# Patient Record
Sex: Female | Born: 2000 | Race: Black or African American | Hispanic: No | Marital: Single | Smoking: Never smoker
Health system: Southern US, Community
[De-identification: ages and names within clinical notes are randomized; demographics above are authoritative.]

## PROBLEM LIST (undated history)

## (undated) DIAGNOSIS — N83209 Unspecified ovarian cyst, unspecified side: Secondary | ICD-10-CM

## (undated) DIAGNOSIS — E079 Disorder of thyroid, unspecified: Secondary | ICD-10-CM

## (undated) DIAGNOSIS — G43909 Migraine, unspecified, not intractable, without status migrainosus: Secondary | ICD-10-CM

## (undated) HISTORY — PX: TONSILLECTOMY: SUR1361

---
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
## 2019-01-08 ENCOUNTER — Encounter (HOSPITAL_BASED_OUTPATIENT_CLINIC_OR_DEPARTMENT_OTHER): Payer: Self-pay

## 2019-01-08 ENCOUNTER — Other Ambulatory Visit: Payer: Self-pay

## 2019-01-08 ENCOUNTER — Emergency Department (HOSPITAL_BASED_OUTPATIENT_CLINIC_OR_DEPARTMENT_OTHER)
Admission: EM | Admit: 2019-01-08 | Discharge: 2019-01-09 | Disposition: A | Discharge status: Home / Self Care | Payer: Medicaid Other | Attending: Emergency Medicine | Admitting: Emergency Medicine

## 2019-01-08 DIAGNOSIS — Z20822 Contact with and (suspected) exposure to covid-19: Secondary | ICD-10-CM

## 2019-01-08 DIAGNOSIS — Z20828 Contact with and (suspected) exposure to other viral communicable diseases: Secondary | ICD-10-CM

## 2019-01-08 DIAGNOSIS — Z79899 Other long term (current) drug therapy: Secondary | ICD-10-CM | POA: Insufficient documentation

## 2019-01-08 DIAGNOSIS — R0602 Shortness of breath: Secondary | ICD-10-CM | POA: Diagnosis not present

## 2019-01-08 DIAGNOSIS — R438 Other disturbances of smell and taste: Secondary | ICD-10-CM | POA: Insufficient documentation

## 2019-01-08 DIAGNOSIS — R05 Cough: Secondary | ICD-10-CM | POA: Diagnosis present

## 2019-01-08 DIAGNOSIS — R0789 Other chest pain: Secondary | ICD-10-CM | POA: Diagnosis not present

## 2019-01-08 NOTE — ED Triage Notes (Addendum)
Pt c/o flu like sx started 2 weeks ago-requesting covid test due to she has been in close contact with a family meber that has been in close contact with a +covid person-NAD-steady gait

## 2019-01-09 LAB — SARS CORONAVIRUS 2 (TAT 6-24 HRS): SARS Coronavirus 2: NEGATIVE

## 2019-01-09 MED ORDER — NAPROXEN 250 MG PO TABS
500.0000 mg | ORAL_TABLET | Freq: Once | ORAL | Status: AC
  Administered 2019-01-09: 500 mg via ORAL
  Filled 2019-01-09: qty 2

## 2019-01-09 NOTE — ED Provider Notes (Signed)
[facility] Provider Note: Lowella Dell, MD, FACEP  CSN: [id number] MRN: [id number] ARRIVAL: 01/08/19 at 2213 ROOM: [facility]04/[facility]   CHIEF COMPLAINT  Cough   HISTORY OF PRESENT ILLNESS  01/09/19 1:16 AM Judy Rubio is a 18 y.o. female who developed cough and mild shortness of breath 2 weeks ago.  About a week ago she developed loss of taste and smell which resolved after several days.  She is not aware of having a fever.  She has had exposure to a Covid positive person.  She has been taking Robitussin and Mucinex with partial relief of symptoms.  She is also having chest wall pain when she coughs.   Past Medical History:  Diagnosis Date  . Thyroid disease     Past Surgical History:  Procedure Laterality Date  . TONSILLECTOMY      No family history on file.  Social History   Tobacco Use  . Smoking status: Never Smoker  . Smokeless tobacco: Never Used  Substance Use Topics  . Alcohol use: No  . Drug use: No    Prior to Admission medications   Medication Sig Start Date End Date Taking? Authorizing Provider  dicyclomine (BENTYL) 20 MG tablet Take 1 tablet (20 mg total) by mouth 2 (two) times daily as needed for spasms. 12/04/15   Loren Racer, MD  ondansetron (ZOFRAN ODT) 4 MG disintegrating tablet 4mg  ODT q4 hours prn nausea/vomit 12/04/15   12/06/15, MD  polyethylene glycol [facility] / GLYCOLAX) packet Take 17 g by mouth daily. 12/04/15   12/06/15, MD    Allergies Patient has no known allergies.   REVIEW OF SYSTEMS  Negative except as noted here or in the History of Present Illness.   PHYSICAL EXAMINATION  Initial Vital Signs Blood pressure (!) 126/94, pulse 83, temperature 99.1 F (37.3 C), temperature source Oral, resp. rate 20, SpO2 100 %.  Examination General: Well-developed, well-nourished female in no acute distress; appearance consistent with age of record HENT: normocephalic; atraumatic Eyes: Normal appearance  Neck: supple Heart: regular rate and rhythm Lungs: clear to auscultation bilaterally Abdomen: soft; nondistended; nontender; bowel sounds present Extremities: No deformity; full range of motion Neurologic: Awake, alert and oriented; motor function intact in all extremities and symmetric; no facial droop Skin: Warm and dry Psychiatric: Normal mood and affect   RESULTS  Summary of this visit's results, reviewed and interpreted by myself:   EKG Interpretation  Date/Time:    Ventricular Rate:    PR Interval:    QRS Duration:   QT Interval:    QTC Calculation:   R Axis:     Text Interpretation:        Laboratory Studies: No results found for this or any previous visit (from the past 24 hour(s)). Imaging Studies: No results found.  ED COURSE and MDM  Nursing notes, initial and subsequent vitals signs, including pulse oximetry, reviewed and interpreted by myself.  Vitals:   01/08/19 2237  BP: (!) 126/94  Pulse: 83  Resp: 20  Temp: 99.1 F (37.3 C)  TempSrc: Oral  SpO2: 100%   Medications  naproxen (NAPROSYN) tablet 500 mg (has no administration in time range)    Patient symptoms are not severe and her examination is normal.  We will provide a Covid test and advised her to remain in quarantine pending results.  Judy Rubio was evaluated in Emergency Department on 01/09/2019 for the symptoms described in the history of present illness. She was evaluated in  the context of the global COVID-19 pandemic, which necessitated consideration that the patient might be at risk for infection with the SARS-CoV-2 virus that causes COVID-19. Institutional protocols and algorithms that pertain to the evaluation of patients at risk for COVID-19 are in a state of rapid change based on information released by regulatory bodies including the CDC and federal and state organizations. These policies and algorithms were followed during the patient's care in the ED.  PROCEDURES  Procedures    ED DIAGNOSES     ICD-10-CM   1. Cough with exposure to COVID-19 virus  R05    Z20.828        Shanon Rosser, MD 01/09/19 [phone]

## 2019-12-21 ENCOUNTER — Encounter (HOSPITAL_BASED_OUTPATIENT_CLINIC_OR_DEPARTMENT_OTHER): Payer: Self-pay | Admitting: Emergency Medicine

## 2019-12-21 ENCOUNTER — Emergency Department (HOSPITAL_BASED_OUTPATIENT_CLINIC_OR_DEPARTMENT_OTHER)
Admission: EM | Admit: 2019-12-21 | Discharge: 2019-12-21 | Disposition: A | Discharge status: Home / Self Care | Payer: Medicaid Other | Attending: Emergency Medicine | Admitting: Emergency Medicine

## 2019-12-21 ENCOUNTER — Other Ambulatory Visit: Payer: Self-pay

## 2019-12-21 ENCOUNTER — Emergency Department (HOSPITAL_BASED_OUTPATIENT_CLINIC_OR_DEPARTMENT_OTHER): Payer: Medicaid Other

## 2019-12-21 DIAGNOSIS — X500XXA Overexertion from strenuous movement or load, initial encounter: Secondary | ICD-10-CM | POA: Insufficient documentation

## 2019-12-21 DIAGNOSIS — S46911A Strain of unspecified muscle, fascia and tendon at shoulder and upper arm level, right arm, initial encounter: Secondary | ICD-10-CM

## 2019-12-21 DIAGNOSIS — S4991XA Unspecified injury of right shoulder and upper arm, initial encounter: Secondary | ICD-10-CM | POA: Diagnosis present

## 2019-12-21 LAB — PREGNANCY, URINE: Preg Test, Ur: NEGATIVE

## 2019-12-21 MED ORDER — METHOCARBAMOL 500 MG PO TABS: 500.0000 mg | ORAL_TABLET | Freq: Every evening | ORAL | 0 refills | Status: DC | PRN

## 2019-12-21 MED ORDER — ACETAMINOPHEN 500 MG PO TABS
1000.0000 mg | ORAL_TABLET | Freq: Once | ORAL | Status: AC
  Administered 2019-12-21: 1000 mg via ORAL
  Filled 2019-12-21: qty 2

## 2019-12-21 NOTE — ED Provider Notes (Signed)
[facility] EMERGENCY DEPARTMENT Provider Note   CSN: [id number] Arrival date & time: 12/21/19  1611     History Chief Complaint  Patient presents with  . Shoulder Pain    Judy Rubio is a 19 y.o. female presenting for evaluation of right shoulder pain.  Patient status prior to arrival she went to grab something from the backseat and left today and had acute pain in her right shoulder.  Since then, she has continued to have pain of her right shoulder.  It is worse with movement, nothing makes it better.  It does not radiate.  No numbness in the hands.  She has not taken anything for it including Tylenol or ibuprofen.  She denies previous history of problems with her shoulder.  She denies pain in her neck or back.  HPI     Past Medical History:  Diagnosis Date  . Thyroid disease     There are no problems to display for this patient.   Past Surgical History:  Procedure Laterality Date  . TONSILLECTOMY       OB History   No obstetric history on file.     No family history on file.  Social History   Tobacco Use  . Smoking status: Never Smoker  . Smokeless tobacco: Never Used  Vaping Use  . Vaping Use: Never used  Substance Use Topics  . Alcohol use: No  . Drug use: No    Home Medications Prior to Admission medications   Medication Sig Start Date End Date Taking? Authorizing Provider  dicyclomine (BENTYL) 20 MG tablet Take 1 tablet (20 mg total) by mouth 2 (two) times daily as needed for spasms. 12/04/15   Loren Racer, MD  methocarbamol (ROBAXIN) 500 MG tablet Take 1 tablet (500 mg total) by mouth at bedtime as needed for muscle spasms. 12/21/19   Michaelle Bottomley, PA-C  ondansetron (ZOFRAN ODT) 4 MG disintegrating tablet 4mg  ODT q4 hours prn nausea/vomit 12/04/15   12/06/15, MD  polyethylene glycol [facility] / GLYCOLAX) packet Take 17 g by mouth daily. 12/04/15   12/06/15, MD    Allergies    Patient has no known  allergies.  Review of Systems   Review of Systems  Musculoskeletal: Positive for arthralgias.  Neurological: Negative for numbness.    Physical Exam Updated Vital Signs BP 110/82 (BP Location: Left Arm)   Pulse 85   Temp 98.2 F (36.8 C) (Oral)   Resp 16   Ht 5\' 3"  (1.6 m)   Wt 66.4 kg   LMP 12/02/2019   SpO2 100%   BMI 25.92 kg/m   Physical Exam Vitals and nursing note reviewed.  Constitutional:      General: She is not in acute distress.    Appearance: She is well-developed.  HENT:     Head: Normocephalic and atraumatic.  Neck:     Comments: No TTP of midline C-spine Pulmonary:     Effort: Pulmonary effort is normal.  Abdominal:     General: There is no distension.  Musculoskeletal:        General: Tenderness present.     Cervical back: Normal range of motion.     Comments: No obvious deformity or swelling of the right shoulder.  Tenderness palpation of the trapezius and right shoulder musculature.  No pain over bony protuberances.  Limited range of motion due to pain.  Negative empty can test.  Negative drop arm test. Radial pulses 2+ bilaterally. Equal grip strength bilaterally.  Skin:    General: Skin is warm.     Findings: No rash.  Neurological:     Mental Status: She is alert and oriented to person, place, and time.     ED Results / Procedures / Treatments   Labs (all labs ordered are listed, but only abnormal results are displayed) Labs Reviewed  PREGNANCY, URINE    EKG None  Radiology DG Shoulder Right  Result Date: 12/21/2019 CLINICAL DATA:  Patient was sitting in her car today and reached behind her for her bag and started having pain on top of her Rt shoulder EXAM: RIGHT SHOULDER - 2+ VIEW COMPARISON:  None. FINDINGS: There is no evidence of fracture or dislocation. There is no evidence of arthropathy or other focal bone abnormality. Soft tissues are unremarkable. IMPRESSION: Negative. Electronically Signed   By: Emmaline Kluver M.D.   On:  12/21/2019 17:08    Procedures Procedures (including critical care time)  Medications Ordered in ED Medications  acetaminophen (TYLENOL) tablet 1,000 mg (has no administration in time range)    ED Course  I have reviewed the triage vital signs and the nursing notes.  Pertinent labs & imaging results that were available during my care of the patient were reviewed by me and considered in my medical decision making (see chart for details).    MDM Rules/Calculators/A&P                          Patient presenting for evaluation of right shoulder pain.  On exam, patient is nontoxic.  She is neurovascular intact.  X-rays obtained from triage read interpreted by me, no fracture dislocation.  Pain is reproducible with palpation of the musculature, worse with movement.  Likely MSK.  No obvious complete rotator cuff tear.  Likely strain.  Discussed findings with patient.  Discussed symptomatic treatment with sling and NSAIDs and muscle x-rays.  Follow-up with Ortho as needed.  At this time, patient appears safe for discharge.  Return precautions given. patient states she understands and agrees to plan.  Final Clinical Impression(s) / ED Diagnoses Final diagnoses:  Strain of right shoulder, initial encounter    Rx / DC Orders ED Discharge Orders         Ordered    methocarbamol (ROBAXIN) 500 MG tablet  At bedtime PRN        12/21/19 1828           Alveria Apley, PA-C 12/21/19 1834    Pricilla Loveless, MD 12/22/19 [phone]

## 2019-12-21 NOTE — Discharge Instructions (Signed)
Take ibuprofen 3 times a day with meals.  Do not take other anti-inflammatories at the same time (Advil, Motrin, naproxen, Aleve). You may supplement with Tylenol if you need further pain control. Use robaxin as needed for severe pain.  Have caution, this may make you tired or groggy.  Do not drive or operate heavy machinery while taking this medicine. Use ice packs or heating pads if this helps control your pain. Use the sling to help with support and pain control. You will likely have continued pain over the next couple days.  Follow-up with orthopedics in 1 week if your symptoms are not improving. Return to the emergency room if you develop numbness, severe worsening pain, or any new, worsening, or concerning symptoms.

## 2019-12-21 NOTE — ED Triage Notes (Signed)
Reports reaching in the back seat to grab something when she felt pain in the right shoulder.  Now having pain.  Has not taken anything.  Does not appear deformed in triage.  Able to remove jacket without difficulty.

## 2019-12-29 ENCOUNTER — Ambulatory Visit: Payer: Medicaid Other | Admitting: Family Medicine

## 2020-01-01 ENCOUNTER — Ambulatory Visit: Payer: Medicaid Other | Admitting: Family Medicine

## 2020-06-23 ENCOUNTER — Emergency Department (HOSPITAL_BASED_OUTPATIENT_CLINIC_OR_DEPARTMENT_OTHER)
Admission: EM | Admit: 2020-06-23 | Discharge: 2020-06-23 | Disposition: A | Discharge status: Home / Self Care | Payer: Medicaid Other | Attending: Emergency Medicine | Admitting: Emergency Medicine

## 2020-06-23 ENCOUNTER — Encounter (HOSPITAL_BASED_OUTPATIENT_CLINIC_OR_DEPARTMENT_OTHER): Payer: Self-pay

## 2020-06-23 ENCOUNTER — Other Ambulatory Visit: Payer: Self-pay

## 2020-06-23 DIAGNOSIS — R102 Pelvic and perineal pain: Secondary | ICD-10-CM | POA: Diagnosis not present

## 2020-06-23 DIAGNOSIS — M545 Low back pain, unspecified: Secondary | ICD-10-CM | POA: Diagnosis not present

## 2020-06-23 LAB — WET PREP, GENITAL
Sperm: NONE SEEN
Trich, Wet Prep: NONE SEEN
Yeast Wet Prep HPF POC: NONE SEEN

## 2020-06-23 LAB — URINALYSIS, ROUTINE W REFLEX MICROSCOPIC
Bilirubin Urine: NEGATIVE
Glucose, UA: NEGATIVE mg/dL
Hgb urine dipstick: NEGATIVE
Ketones, ur: NEGATIVE mg/dL
Leukocytes,Ua: NEGATIVE
Nitrite: NEGATIVE
Protein, ur: NEGATIVE mg/dL
Specific Gravity, Urine: 1.01 (ref 1.005–1.030)
pH: 7.5 (ref 5.0–8.0)

## 2020-06-23 LAB — PREGNANCY, URINE: Preg Test, Ur: NEGATIVE

## 2020-06-23 MED ORDER — METRONIDAZOLE 500 MG PO TABS: 500.0000 mg | ORAL_TABLET | Freq: Two times a day (BID) | ORAL | 0 refills | Status: AC

## 2020-06-23 MED ORDER — KETOROLAC TROMETHAMINE 30 MG/ML IJ SOLN
30.0000 mg | Freq: Once | INTRAMUSCULAR | Status: AC
  Administered 2020-06-23: 30 mg via INTRAMUSCULAR
  Filled 2020-06-23: qty 1

## 2020-06-23 NOTE — ED Triage Notes (Signed)
Pt c/o "ovarian cyst" pain to right pelvic area x 2-3 weeks-NAD-steady gait

## 2020-06-23 NOTE — Discharge Instructions (Addendum)
The treatment that was sent to your pharmacy may help your abdominal discomfort.  I recommend following up with your OB/GYN for further care.  I do not think there is anything dangerous or urgent causing her discomfort.

## 2020-06-23 NOTE — ED Provider Notes (Signed)
[facility] EMERGENCY DEPARTMENT Provider Note   CSN: [id number] Arrival date & time: 06/23/20  1747     History Chief Complaint  Patient presents with  . Pelvic Pain    Judy Rubio is a 20 y.o. female.  Judy Rubio is a 20 year old woman who presents to the ED with 2 weeks of lower abdominal/pelvic discomfort.  She is a previous medical history notable for possible ovarian cyst and possible previous ovarian cyst rupture.  She reports that her lower abdominal pain seem to start about 2 weeks ago she does not remember exactly when it started.  Seem to build slowly over time.  The pain is generally dull/achy in character other occasionally sharp.  She notes some radiation around her right side to her right lower back.  The pain is primarily in her lower back as opposed to middle or upper back.  She does not think that this pain is associated with her menstrual period.  She specifically denies dysuria or polyuria.  She also reports mild, thin malodorous vaginal discharge started several days ago.  Her last menstrual period ended 2 weeks ago.  She has not been sexually active since her last menstrual period.        Past Medical History:  Diagnosis Date  . Thyroid disease     There are no problems to display for this patient.   Past Surgical History:  Procedure Laterality Date  . TONSILLECTOMY       OB History   No obstetric history on file.     No family history on file.  Social History   Tobacco Use  . Smoking status: Never Smoker  . Smokeless tobacco: Never Used  Vaping Use  . Vaping Use: Never used  Substance Use Topics  . Alcohol use: No  . Drug use: No    Home Medications Prior to Admission medications   Medication Sig Start Date End Date Taking? Authorizing Provider  dicyclomine (BENTYL) 20 MG tablet Take 1 tablet (20 mg total) by mouth 2 (two) times daily as needed for spasms. 12/04/15   Loren Racer, MD  methocarbamol (ROBAXIN) 500 MG  tablet Take 1 tablet (500 mg total) by mouth at bedtime as needed for muscle spasms. 12/21/19   Caccavale, Sophia, PA-C  ondansetron (ZOFRAN ODT) 4 MG disintegrating tablet 4mg  ODT q4 hours prn nausea/vomit 12/04/15   12/06/15, MD  polyethylene glycol [facility] / GLYCOLAX) packet Take 17 g by mouth daily. 12/04/15   12/06/15, MD    Allergies    Patient has no known allergies.  Review of Systems   Review of Systems  Constitutional: Negative for chills, fatigue and fever.  Respiratory: Negative for shortness of breath.   Cardiovascular: Negative for chest pain and palpitations.  Gastrointestinal: Positive for abdominal pain. Negative for diarrhea, nausea and vomiting.  Endocrine: Negative for polyuria.  Genitourinary: Negative for dysuria and flank pain.  Skin: Negative for rash and wound.    Physical Exam Updated Vital Signs BP 134/88 (BP Location: Left Arm)   Pulse 98   Temp 99.5 F (37.5 C) (Oral)   Resp 16   Ht 5' (1.524 m)   Wt 63.5 kg   LMP 06/05/2020   SpO2 100%   BMI 27.34 kg/m   Physical Exam Exam conducted with a chaperone present.  Constitutional:      Appearance: Normal appearance.  HENT:     Head: Normocephalic and atraumatic.     Nose: Nose normal.  Mouth/Throat:     Mouth: Mucous membranes are moist.  Eyes:     Pupils: Pupils are equal, round, and reactive to light.  Cardiovascular:     Rate and Rhythm: Normal rate and regular rhythm.     Pulses: Normal pulses.     Heart sounds: Normal heart sounds.  Pulmonary:     Effort: Pulmonary effort is normal.     Breath sounds: Normal breath sounds.  Abdominal:     General: Abdomen is flat.     Palpations: Abdomen is soft.  Genitourinary:    General: Normal vulva.     Exam position: Lithotomy position.     Pubic Area: No rash.      Comments: Normal-appearing external genitalia.  Moist, pink vaginal mucosa.  No significant tenderness with insertion of the speculum.  Mild, thin white  vaginal discharge.  Samples taken.  Bimanual exam performed.  No significant cervical motion tenderness or Nexa tenderness.  No adnexal masses appreciated. Skin:    Capillary Refill: Capillary refill takes less than 2 seconds.  Neurological:     General: No focal deficit present.     Mental Status: She is alert.     ED Results / Procedures / Treatments   Labs (all labs ordered are listed, but only abnormal results are displayed) Labs Reviewed  URINALYSIS, ROUTINE W REFLEX MICROSCOPIC  PREGNANCY, URINE    EKG None  Radiology No results found.  Procedures Procedures   Medications Ordered in ED Medications - No data to display  ED Course  I have reviewed the triage vital signs and the nursing notes.  Pertinent labs & imaging results that were available during my care of the patient were reviewed by me and considered in my medical decision making (see chart for details).    MDM Rules/Calculators/A&P                          This 20 year old female presents to the ED with roughly 2 weeks of lower abdominal/pelvic discomfort.  She was primarily concerned about worsened symptoms related to previously discovered ovarian cyst.  Chart review is not able to verify this previously mentioned ovarian cyst.  Based on her history, I have a low suspicion for ovarian torsion as it does not seem to be a sudden, abrupt onset or significant abdominal symptoms.  Ectopic pregnancy has been ruled out.  Low suspicion for PID based on benign bimanual exam.  Low suspicion for kidney stone.  She is sexually active and we will screen her for gonorrhea/chlamydia.  Wet prep today was notable for bacterial vaginosis.  We will treat her with a 7-day course of metronidazole and have her follow-up with her OB/GYN.  I am not confident that bacterial vaginosis is responsible for all of her symptoms but do not see any reason for further work-up here in the emergency room.  Final Clinical Impression(s) / ED  Diagnoses Final diagnoses:  None    Rx / DC Orders ED Discharge Orders    None       Mirian Mo, MD 06/23/20 1911    Maia Plan, MD 06/28/20 [phone]

## 2020-06-24 LAB — GC/CHLAMYDIA PROBE AMP (~~LOC~~) NOT AT ARMC
Chlamydia: NEGATIVE
Comment: NEGATIVE
Comment: NORMAL
Neisseria Gonorrhea: NEGATIVE

## 2021-05-16 ENCOUNTER — Encounter (HOSPITAL_BASED_OUTPATIENT_CLINIC_OR_DEPARTMENT_OTHER): Payer: Self-pay

## 2021-05-16 ENCOUNTER — Emergency Department (HOSPITAL_BASED_OUTPATIENT_CLINIC_OR_DEPARTMENT_OTHER)
Admission: EM | Admit: 2021-05-16 | Discharge: 2021-05-16 | Disposition: A | Discharge status: Home / Self Care | Payer: Medicaid Other | Attending: Emergency Medicine | Admitting: Emergency Medicine

## 2021-05-16 ENCOUNTER — Other Ambulatory Visit: Payer: Self-pay

## 2021-05-16 DIAGNOSIS — Z20822 Contact with and (suspected) exposure to covid-19: Secondary | ICD-10-CM | POA: Insufficient documentation

## 2021-05-16 DIAGNOSIS — J302 Other seasonal allergic rhinitis: Secondary | ICD-10-CM | POA: Insufficient documentation

## 2021-05-16 DIAGNOSIS — J029 Acute pharyngitis, unspecified: Secondary | ICD-10-CM | POA: Diagnosis present

## 2021-05-16 DIAGNOSIS — J028 Acute pharyngitis due to other specified organisms: Secondary | ICD-10-CM | POA: Insufficient documentation

## 2021-05-16 DIAGNOSIS — B9789 Other viral agents as the cause of diseases classified elsewhere: Secondary | ICD-10-CM | POA: Diagnosis not present

## 2021-05-16 LAB — URINALYSIS, ROUTINE W REFLEX MICROSCOPIC
Bilirubin Urine: NEGATIVE
Glucose, UA: NEGATIVE mg/dL
Hgb urine dipstick: NEGATIVE
Ketones, ur: NEGATIVE mg/dL
Leukocytes,Ua: NEGATIVE
Nitrite: NEGATIVE
Protein, ur: NEGATIVE mg/dL
Specific Gravity, Urine: 1.02 (ref 1.005–1.030)
pH: 7 (ref 5.0–8.0)

## 2021-05-16 LAB — RESP PANEL BY RT-PCR (FLU A&B, COVID) ARPGX2
Influenza A by PCR: NEGATIVE
Influenza B by PCR: NEGATIVE
SARS Coronavirus 2 by RT PCR: NEGATIVE

## 2021-05-16 LAB — GROUP A STREP BY PCR: Group A Strep by PCR: NOT DETECTED

## 2021-05-16 MED ORDER — FLUTICASONE PROPIONATE 50 MCG/ACT NA SUSP: 2.0000 | Freq: Every day | NASAL | 0 refills | Status: AC

## 2021-05-16 MED ORDER — LIDOCAINE VISCOUS HCL 2 % MT SOLN: 15.0000 mL | Freq: Four times a day (QID) | OROMUCOSAL | 0 refills | Status: AC | PRN

## 2021-05-16 MED ORDER — CETIRIZINE HCL 10 MG PO TABS: 10.0000 mg | ORAL_TABLET | Freq: Every day | ORAL | 0 refills | Status: AC

## 2021-05-16 MED ORDER — NAPROXEN 500 MG PO TABS: 500.0000 mg | ORAL_TABLET | Freq: Two times a day (BID) | ORAL | 0 refills | Status: DC

## 2021-05-16 NOTE — Discharge Instructions (Addendum)
COVID, flu, strep test was negative.  This is likely combination of a viral sore throat as well as seasonal allergies.  I have written you for a few medications to help with your symptoms. ? ?Your urine did not show any evidence of infection. ? ?Follow-up outpatient. ?

## 2021-05-16 NOTE — ED Provider Notes (Signed)
?[facility] EMERGENCY DEPARTMENT ?Provider Note ? ? ?CSN: [id number] ?Arrival date & time: 05/16/21  2051 ? ?  ?History ? ?Chief Complaint  ?Patient presents with  ? Sore Throat  ? ? ?Judy Rubio is a 21 y.o. female here for evaluation of sore throat which began 2 days ago.  Also associated congestion, rhinorrhea and cough.  Some itchy and watery eyes.  No meds PTA.  No prior diagnosis seasonal allergies.  No neck stiffness neck rigidity.  No fever.  No emesis.  No chest pain, shortness breath, abdominal pain.  Denies chance of pregnancy. ? ?HPI ? ?  ? ?Home Medications ?Prior to Admission medications   ?Medication Sig Start Date End Date Taking? Authorizing Provider  ?cetirizine (ZYRTEC ALLERGY) 10 MG tablet Take 1 tablet (10 mg total) by mouth daily. 05/16/21  Yes Adya Wirz A, PA-C  ?fluticasone (FLONASE) 50 MCG/ACT nasal spray Place 2 sprays into both nostrils daily. 05/16/21  Yes Denasia Venn A, PA-C  ?lidocaine (XYLOCAINE) 2 % solution Use as directed 15 mLs in the mouth or throat every 6 (six) hours as needed for mouth pain. 05/16/21  Yes Megumi Treaster A, PA-C  ?naproxen (NAPROSYN) 500 MG tablet Take 1 tablet (500 mg total) by mouth 2 (two) times daily. 05/16/21  Yes Maeve Debord A, PA-C  ?dicyclomine (BENTYL) 20 MG tablet Take 1 tablet (20 mg total) by mouth 2 (two) times daily as needed for spasms. 12/04/15   Loren RacerYelverton, David, MD  ?methocarbamol (ROBAXIN) 500 MG tablet Take 1 tablet (500 mg total) by mouth at bedtime as needed for muscle spasms. 12/21/19   Caccavale, Sophia, PA-C  ?ondansetron (ZOFRAN ODT) 4 MG disintegrating tablet 4mg  ODT q4 hours prn nausea/vomit 12/04/15   Loren RacerYelverton, David, MD  ?polyethylene glycol (MIRALAX / GLYCOLAX) packet Take 17 g by mouth daily. 12/04/15   Loren RacerYelverton, David, MD  ?   ? ?Allergies    ?Patient has no known allergies.   ? ?Review of Systems   ?Review of Systems  ?Constitutional: Negative.   ?HENT:  Positive for congestion, postnasal drip,  rhinorrhea, sneezing and sore throat. Negative for ear discharge, sinus pressure, sinus pain, trouble swallowing and voice change.   ?Respiratory:  Positive for cough. Negative for apnea, choking, chest tightness, shortness of breath, wheezing and stridor.   ?Cardiovascular: Negative.   ?Gastrointestinal: Negative.   ?Genitourinary: Negative.   ?Musculoskeletal: Negative.   ?Skin: Negative.   ?Neurological: Negative.   ?All other systems reviewed and are negative. ? ?Physical Exam ?Updated Vital Signs ?BP 118/77 (BP Location: Left Arm)   Pulse 86   Temp 99.1 ?F (37.3 ?C) (Oral)   Resp 16   Ht 5' (1.524 m)   Wt 69.3 kg   LMP 05/11/2021 (Exact Date)   SpO2 100%   BMI 29.84 kg/m?  ?Physical Exam ?Vitals and nursing note reviewed.  ?Constitutional:   ?   General: She is not in acute distress. ?   Appearance: She is well-developed. She is not ill-appearing, toxic-appearing or diaphoretic.  ?HENT:  ?   Head: Atraumatic.  ?   Jaw: There is normal jaw occlusion.  ?   Nose: Congestion and rhinorrhea present. Rhinorrhea is clear.  ?   Right Sinus: No maxillary sinus tenderness or frontal sinus tenderness.  ?   Left Sinus: No maxillary sinus tenderness or frontal sinus tenderness.  ?   Comments: Clear rhinorrhea and congestion ?   Mouth/Throat:  ?   Lips: Pink.  ?  Mouth: Mucous membranes are moist.  ?   Tongue: No lesions. Tongue does not deviate from midline.  ?   Palate: No mass and lesions.  ?   Pharynx: Oropharynx is clear. Uvula midline. Posterior oropharyngeal erythema present. No pharyngeal swelling, oropharyngeal exudate or uvula swelling.  ?   Tonsils: No tonsillar exudate or tonsillar abscesses. 0 on the right. 0 on the left.  ?   Comments: Tongue midline, uvula midline.  No evidence of tonsillar exudates or edema.  Minimal posterior pharyngeal erythema without any lesions. No PTA, RPA ?Eyes:  ?   Pupils: Pupils are equal, round, and reactive to light.  ?Neck:  ?   Trachea: Trachea and phonation normal.  ?    Comments: Neck stiffness or neck rigidity. ?Cardiovascular:  ?   Rate and Rhythm: Normal rate.  ?   Pulses: Normal pulses.  ?   Heart sounds: Normal heart sounds.  ?Pulmonary:  ?   Effort: Pulmonary effort is normal. No respiratory distress.  ?   Breath sounds: Normal breath sounds and air entry.  ?Abdominal:  ?   General: Bowel sounds are normal. There is no distension.  ?   Palpations: Abdomen is soft.  ?   Tenderness: There is no abdominal tenderness.  ?Musculoskeletal:     ?   General: Normal range of motion.  ?   Cervical back: Full passive range of motion without pain and normal range of motion.  ?Lymphadenopathy:  ?   Cervical: No cervical adenopathy.  ?Skin: ?   General: Skin is warm and dry.  ?   Capillary Refill: Capillary refill takes less than 2 seconds.  ?Neurological:  ?   General: No focal deficit present.  ?   Mental Status: She is alert.  ?Psychiatric:     ?   Mood and Affect: Mood normal.  ? ?ED Results / Procedures / Treatments   ?Labs ?(all labs ordered are listed, but only abnormal results are displayed) ?Labs Reviewed  ?GROUP A STREP BY PCR  ?RESP PANEL BY RT-PCR (FLU A&B, COVID) [id number]  ?URINALYSIS, ROUTINE W REFLEX MICROSCOPIC  ? ? ?EKG ?None ? ?Radiology ?No results found. ? ?Procedures ?Procedures  ? ? ?Medications Ordered in ED ?Medications - No data to display ? ?ED Course/ Medical Decision Making/ A&P ?  ? ?Here for evaluation of sore throat, cough, congestion, rhinorrhea and sneezing over the last 2 days.  Tolerating p.o. intake.  She is afebrile, nonseptic, not ill-appearing.  Heart and lungs clear.  Does have some clear congestion and rhinorrhea. Posterior oropharynx is mildly erythematous however no exudates.  Tonsils without edema. Uvula midline. No PTA, RPA. No neck stiffness or neck rigidity. Heart and lungs clear. ? ?Labs personally viewed and interpreted: ? ?COVID< FLU neg ?Strep neg ?UA neg for infection ? ?Suspect patient with viral URI versus seasonal allergies.  Will treat  symptomatically.  We will have her follow-up outpatient.  She appears overall well, tolerating p.o. intake with no evidence of respiratory distress, low suspicion for deep space infection requiring surgical intervention or inpatient admission. ? ?The patient has been appropriately medically screened and/or stabilized in the ED. I have low suspicion for any other emergent medical condition which would require further screening, evaluation or treatment in the ED or require inpatient management. ? ?Patient is hemodynamically stable and in no acute distress.  Patient able to ambulate in department prior to ED.  Evaluation does not show acute pathology that would require ongoing or additional emergent  interventions while in the emergency department or further inpatient treatment.  I have discussed the diagnosis with the patient and answered all questions.  Pain is been managed while in the emergency department and patient has no further complaints prior to discharge.  Patient is comfortable with plan discussed in room and is stable for discharge at this time.  I have discussed strict return precautions for returning to the emergency department.  Patient was encouraged to follow-up with PCP/specialist refer to at discharge.  ? ? ?                        ?Medical Decision Making ?Amount and/or Complexity of Data Reviewed ?External Data Reviewed: labs, radiology and notes. ?Labs: ordered. Decision-making details documented in ED Course. ? ?Risk ?OTC drugs. ?Prescription drug management. ?Diagnosis or treatment significantly limited by social determinants of health. ? ? ? ? ? ? ? ? ?Final Clinical Impression(s) / ED Diagnoses ?Final diagnoses:  ?Viral pharyngitis  ?Seasonal allergies  ? ? ?Rx / DC Orders ?ED Discharge Orders   ? ?      Ordered  ?  cetirizine (ZYRTEC ALLERGY) 10 MG tablet  Daily       ? 05/16/21 2250  ?  fluticasone (FLONASE) 50 MCG/ACT nasal spray  Daily       ? 05/16/21 2250  ?  lidocaine (XYLOCAINE) 2 %  solution  Every 6 hours PRN       ? 05/16/21 2250  ?  naproxen (NAPROSYN) 500 MG tablet  2 times daily       ? 05/16/21 2250  ? ?  ?  ? ?  ? ? ?  ?Terran Hollenkamp A, PA-C ?05/16/21 2303 ? ?  ?Cheryll Cockayne, MD ?04/10

## 2021-05-16 NOTE — ED Triage Notes (Addendum)
C/o sore throat x 2 days, cough. Also c/o urinary frequency. ?

## 2021-11-25 ENCOUNTER — Other Ambulatory Visit: Payer: Self-pay

## 2021-11-25 ENCOUNTER — Emergency Department (HOSPITAL_BASED_OUTPATIENT_CLINIC_OR_DEPARTMENT_OTHER)
Admission: EM | Admit: 2021-11-25 | Discharge: 2021-11-25 | Disposition: A | Discharge status: Home / Self Care | Payer: Medicaid Other | Attending: Emergency Medicine | Admitting: Emergency Medicine

## 2021-11-25 ENCOUNTER — Encounter (HOSPITAL_BASED_OUTPATIENT_CLINIC_OR_DEPARTMENT_OTHER): Payer: Self-pay | Admitting: *Deleted

## 2021-11-25 DIAGNOSIS — H10021 Other mucopurulent conjunctivitis, right eye: Secondary | ICD-10-CM | POA: Diagnosis not present

## 2021-11-25 DIAGNOSIS — Z1152 Encounter for screening for COVID-19: Secondary | ICD-10-CM | POA: Diagnosis not present

## 2021-11-25 DIAGNOSIS — H109 Unspecified conjunctivitis: Secondary | ICD-10-CM

## 2021-11-25 DIAGNOSIS — H538 Other visual disturbances: Secondary | ICD-10-CM | POA: Diagnosis present

## 2021-11-25 LAB — RESP PANEL BY RT-PCR (FLU A&B, COVID) ARPGX2
Influenza A by PCR: NEGATIVE
Influenza B by PCR: NEGATIVE
SARS Coronavirus 2 by RT PCR: NEGATIVE

## 2021-11-25 MED ORDER — ERYTHROMYCIN 5 MG/GM OP OINT: TOPICAL_OINTMENT | OPHTHALMIC | 0 refills | Status: AC

## 2021-11-25 NOTE — Discharge Instructions (Signed)
Please follow-up with your primary care doctor.  Do not touch your eyes.  If you have been using any make-up, contacts please throw them away.  Do not wear make-up, or contacts until this is cleared.  Wash your hands after applying ointment in the eyes.

## 2021-11-25 NOTE — ED Triage Notes (Signed)
Rt eye redness, itching and red all day

## 2021-11-25 NOTE — ED Provider Notes (Signed)
[facility] Provider Note   CSN: [id number] Arrival date & time: 11/25/21  1525     History {Add pertinent medical, surgical, social history, OB history to HPI:1} Chief Complaint  Patient presents with   Eye Drainage    Judy Rubio is a 21 y.o. female, who presents to the ED secondary to right eye redness and itching for the last day.  States that her partner is sick, and he has had red eyes, and then she woke up today and she had a red eye.  She states that it has been itchy and can have it difficult, no vision changes however.  Denies any shortness of breath, endorses mild shortness sore throat.  No difficulty with swallowing, or fevers or chills.  Has not taken anything for it.  Denies scratching her eye.  Does not wear contacts and has not use any make-up recently.     Home Medications Prior to Admission medications   Medication Sig Start Date End Date Taking? Authorizing Provider  cetirizine (ZYRTEC ALLERGY) 10 MG tablet Take 1 tablet (10 mg total) by mouth daily. 05/16/21   Henderly, Britni A, PA-C  dicyclomine (BENTYL) 20 MG tablet Take 1 tablet (20 mg total) by mouth 2 (two) times daily as needed for spasms. 12/04/15   Julianne Rice, MD  fluticasone (FLONASE) 50 MCG/ACT nasal spray Place 2 sprays into both nostrils daily. 05/16/21   Henderly, Britni A, PA-C  lidocaine (XYLOCAINE) 2 % solution Use as directed 15 mLs in the mouth or throat every 6 (six) hours as needed for mouth pain. 05/16/21   Henderly, Britni A, PA-C  methocarbamol (ROBAXIN) 500 MG tablet Take 1 tablet (500 mg total) by mouth at bedtime as needed for muscle spasms. 12/21/19   Caccavale, Sophia, PA-C  naproxen (NAPROSYN) 500 MG tablet Take 1 tablet (500 mg total) by mouth 2 (two) times daily. 05/16/21   Henderly, Britni A, PA-C  ondansetron (ZOFRAN ODT) 4 MG disintegrating tablet 4mg  ODT q4 hours prn nausea/vomit 12/04/15   Julianne Rice, MD  polyethylene glycol [facility] /  GLYCOLAX) packet Take 17 g by mouth daily. 12/04/15   Julianne Rice, MD      Allergies    Patient has no known allergies.    Review of Systems   Review of Systems  Constitutional:  Negative for chills and fever.  HENT:  Positive for sore throat. Negative for ear discharge, ear pain and sneezing.   Eyes:  Positive for pain, redness and itching. Negative for photophobia and discharge.    Physical Exam Updated Vital Signs Ht 5\' 1"  (1.549 m)   Wt 84.4 kg   BMI 35.14 kg/m  Physical Exam Constitutional:      Appearance: Normal appearance.  HENT:     Head: Normocephalic.     Right Ear: Tympanic membrane normal.     Left Ear: Tympanic membrane normal.     Nose: Nose normal.     Mouth/Throat:     Mouth: Mucous membranes are moist.  Eyes:     General:        Right eye: No discharge.        Left eye: No discharge.     Conjunctiva/sclera:     Right eye: Right conjunctiva is injected. No exudate.    Left eye: Left conjunctiva is not injected. No exudate.    Pupils: Pupils are equal, round, and reactive to light.  Cardiovascular:     Rate and Rhythm: Normal rate and  regular rhythm.  Pulmonary:     Effort: Pulmonary effort is normal.     Breath sounds: Normal breath sounds.  Abdominal:     General: Abdomen is flat.     Palpations: Abdomen is soft.  Musculoskeletal:        General: Normal range of motion.  Skin:    General: Skin is warm and dry.     Capillary Refill: Capillary refill takes less than 2 seconds.  Neurological:     General: No focal deficit present.     Mental Status: She is alert and oriented to person, place, and time.  Psychiatric:        Mood and Affect: Mood normal.     ED Results / Procedures / Treatments   Labs (all labs ordered are listed, but only abnormal results are displayed) Labs Reviewed - No data to display  EKG None  Radiology No results found.  Procedures Procedures  {Document cardiac monitor, telemetry assessment procedure when  appropriate:1}  Medications Ordered in ED Medications - No data to display  ED Course/ Medical Decision Making/ A&P                           Medical Decision Making  ***  {Document critical care time when appropriate:1} {Document review of labs and clinical decision tools ie heart score, Chads2Vasc2 etc:1}  {Document your independent review of radiology images, and any outside records:1} {Document your discussion with family members, caretakers, and with consultants:1} {Document social determinants of health affecting pt's care:1} {Document your decision making why or why not admission, treatments were needed:1} Final Clinical Impression(s) / ED Diagnoses Final diagnoses:  None    Rx / DC Orders ED Discharge Orders     None

## 2022-02-05 ENCOUNTER — Emergency Department (HOSPITAL_BASED_OUTPATIENT_CLINIC_OR_DEPARTMENT_OTHER)
Admission: EM | Admit: 2022-02-05 | Discharge: 2022-02-05 | Disposition: A | Discharge status: Home / Self Care | Payer: Medicaid Other | Attending: Emergency Medicine | Admitting: Emergency Medicine

## 2022-02-05 ENCOUNTER — Encounter (HOSPITAL_BASED_OUTPATIENT_CLINIC_OR_DEPARTMENT_OTHER): Payer: Self-pay | Admitting: Emergency Medicine

## 2022-02-05 ENCOUNTER — Emergency Department (HOSPITAL_BASED_OUTPATIENT_CLINIC_OR_DEPARTMENT_OTHER): Payer: Medicaid Other

## 2022-02-05 ENCOUNTER — Other Ambulatory Visit: Payer: Self-pay

## 2022-02-05 DIAGNOSIS — N3090 Cystitis, unspecified without hematuria: Secondary | ICD-10-CM

## 2022-02-05 DIAGNOSIS — N3000 Acute cystitis without hematuria: Secondary | ICD-10-CM | POA: Diagnosis not present

## 2022-02-05 DIAGNOSIS — R109 Unspecified abdominal pain: Secondary | ICD-10-CM | POA: Diagnosis present

## 2022-02-05 HISTORY — DX: Unspecified ovarian cyst, unspecified side: N83.209

## 2022-02-05 LAB — COMPREHENSIVE METABOLIC PANEL
ALT: 17 U/L (ref 0–44)
AST: 17 U/L (ref 15–41)
Albumin: 3.7 g/dL (ref 3.5–5.0)
Alkaline Phosphatase: 67 U/L (ref 38–126)
Anion gap: 4 — ABNORMAL LOW (ref 5–15)
BUN: 10 mg/dL (ref 6–20)
CO2: 28 mmol/L (ref 22–32)
Calcium: 9 mg/dL (ref 8.9–10.3)
Chloride: 106 mmol/L (ref 98–111)
Creatinine, Ser: 0.87 mg/dL (ref 0.44–1.00)
GFR, Estimated: >60 mL/min (ref >60)
Glucose, Bld: 89 mg/dL (ref 70–99)
Potassium: 3.7 mmol/L (ref 3.5–5.1)
Sodium: 138 mmol/L (ref 135–145)
Total Bilirubin: 0.6 mg/dL (ref 0.3–1.2)
Total Protein: 7.7 g/dL (ref 6.5–8.1)

## 2022-02-05 LAB — CBC WITH DIFFERENTIAL/PLATELET
Abs Immature Granulocytes: 0.02 10*3/uL (ref 0.00–0.07)
Basophils Absolute: 0 10*3/uL (ref 0.0–0.1)
Basophils Relative: 1 %
Eosinophils Absolute: 0.1 10*3/uL (ref 0.0–0.5)
Eosinophils Relative: 1 %
HCT: 38.6 % (ref 36.0–46.0)
Hemoglobin: 13 g/dL (ref 12.0–15.0)
Immature Granulocytes: 0 %
Lymphocytes Relative: 26 %
Lymphs Abs: 1.9 10*3/uL (ref 0.7–4.0)
MCH: 28.1 pg (ref 26.0–34.0)
MCHC: 33.7 g/dL (ref 30.0–36.0)
MCV: 83.5 fL (ref 80.0–100.0)
Monocytes Absolute: 0.5 10*3/uL (ref 0.1–1.0)
Monocytes Relative: 7 %
Neutro Abs: 4.7 10*3/uL (ref 1.7–7.7)
Neutrophils Relative %: 65 %
Platelets: 291 10*3/uL (ref 150–400)
RBC: 4.62 MIL/uL (ref 3.87–5.11)
RDW: 13.1 % (ref 11.5–15.5)
WBC: 7.2 10*3/uL (ref 4.0–10.5)
nRBC: 0 % (ref 0.0–0.2)

## 2022-02-05 LAB — WET PREP, GENITAL
Clue Cells Wet Prep HPF POC: NONE SEEN
Sperm: NONE SEEN
Trich, Wet Prep: NONE SEEN
WBC, Wet Prep HPF POC: <10 (ref <10)
Yeast Wet Prep HPF POC: NONE SEEN

## 2022-02-05 LAB — URINALYSIS, ROUTINE W REFLEX MICROSCOPIC
Bilirubin Urine: NEGATIVE
Glucose, UA: NEGATIVE mg/dL
Hgb urine dipstick: NEGATIVE
Ketones, ur: NEGATIVE mg/dL
Leukocytes,Ua: NEGATIVE
Nitrite: POSITIVE — AB
Protein, ur: NEGATIVE mg/dL
Specific Gravity, Urine: 1.02 (ref 1.005–1.030)
pH: 7 (ref 5.0–8.0)

## 2022-02-05 LAB — URINALYSIS, MICROSCOPIC (REFLEX): RBC / HPF: NONE SEEN RBC/hpf (ref 0–5)

## 2022-02-05 LAB — PREGNANCY, URINE: Preg Test, Ur: NEGATIVE

## 2022-02-05 LAB — LIPASE, BLOOD: Lipase: 33 U/L (ref 11–51)

## 2022-02-05 MED ORDER — IOHEXOL 300 MG/ML  SOLN
80.0000 mL | Freq: Once | INTRAMUSCULAR | Status: AC | PRN
  Administered 2022-02-05: 80 mL via INTRAVENOUS

## 2022-02-05 MED ORDER — IBUPROFEN 600 MG PO TABS: 600.0000 mg | ORAL_TABLET | Freq: Four times a day (QID) | ORAL | 0 refills | Status: AC | PRN

## 2022-02-05 MED ORDER — SODIUM CHLORIDE 0.9 % IV BOLUS
500.0000 mL | Freq: Once | INTRAVENOUS | Status: AC
  Administered 2022-02-05: 500 mL via INTRAVENOUS

## 2022-02-05 MED ORDER — ONDANSETRON 4 MG PO TBDP: 4.0000 mg | ORAL_TABLET | Freq: Three times a day (TID) | ORAL | 0 refills | Status: AC | PRN

## 2022-02-05 MED ORDER — KETOROLAC TROMETHAMINE 30 MG/ML IJ SOLN
15.0000 mg | Freq: Once | INTRAMUSCULAR | Status: AC
  Administered 2022-02-05: 15 mg via INTRAVENOUS
  Filled 2022-02-05: qty 1

## 2022-02-05 MED ORDER — CEFADROXIL 500 MG PO CAPS: 500.0000 mg | ORAL_CAPSULE | Freq: Once | ORAL | Status: DC

## 2022-02-05 MED ORDER — ONDANSETRON HCL 4 MG/2ML IJ SOLN
4.0000 mg | Freq: Once | INTRAMUSCULAR | Status: AC
  Administered 2022-02-05: 4 mg via INTRAVENOUS
  Filled 2022-02-05: qty 2

## 2022-02-05 MED ORDER — CEFADROXIL 500 MG PO CAPS: 500.0000 mg | ORAL_CAPSULE | Freq: Two times a day (BID) | ORAL | 0 refills | Status: AC

## 2022-02-05 NOTE — ED Provider Notes (Signed)
[facility] Provider Note   CSN: [id number] Arrival date & time: 02/05/22  1426     History  Chief Complaint  Patient presents with   Abdominal Pain    Judy Rubio is a 21 y.o. female.   Abdominal Pain   21 year old female presents emergency department with complaints of abdominal pain.  Patient reports pain beginning last night insidiously.  Reports similar pain in the past due to ovarian cysts.  States she was put on birth control to regulate her menstrual cycle which she has been off for 6 months.  She reports associated feelings of nausea with no emesis.  Reports some exacerbation of nausea and pain with consumption of food.  Reports some clear scant vaginal discharge of which has been chronic in nature.  Denies urine symptoms, change in bowel habits, fever, chills, night sweats, chest pain, shortness of breath.  Denies radiation of pain.  Has taken no medication for this.  Denies history of abdominal surgeries.  Past medical history significant for thyroid disease, ovarian cyst.  Home Medications Prior to Admission medications   Medication Sig Start Date End Date Taking? Authorizing Provider  cefadroxil (DURICEF) 500 MG capsule Take 1 capsule (500 mg total) by mouth 2 (two) times daily. 02/05/22  Yes Sherian Maroon A, PA  ibuprofen (ADVIL) 600 MG tablet Take 1 tablet (600 mg total) by mouth every 6 (six) hours as needed. 02/05/22  Yes Sherian Maroon A, PA  ondansetron (ZOFRAN-ODT) 4 MG disintegrating tablet Take 1 tablet (4 mg total) by mouth every 8 (eight) hours as needed for nausea or vomiting. 02/05/22  Yes Sherian Maroon A, PA  cetirizine (ZYRTEC ALLERGY) 10 MG tablet Take 1 tablet (10 mg total) by mouth daily. 05/16/21   Henderly, Britni A, PA-C  dicyclomine (BENTYL) 20 MG tablet Take 1 tablet (20 mg total) by mouth 2 (two) times daily as needed for spasms. Patient not taking: Reported on 11/25/2021 12/04/15   Loren Racer, MD   erythromycin ophthalmic ointment Place a 1/2 inch ribbon of ointment into the lower eyelid 4x/day for 7 days. For R eye. 11/25/21   Small, Brooke L, PA  fluticasone (FLONASE) 50 MCG/ACT nasal spray Place 2 sprays into both nostrils daily. 05/16/21   Henderly, Britni A, PA-C  lidocaine (XYLOCAINE) 2 % solution Use as directed 15 mLs in the mouth or throat every 6 (six) hours as needed for mouth pain. 05/16/21   Henderly, Britni A, PA-C  methocarbamol (ROBAXIN) 500 MG tablet Take 1 tablet (500 mg total) by mouth at bedtime as needed for muscle spasms. 12/21/19   Caccavale, Sophia, PA-C  naproxen (NAPROSYN) 500 MG tablet Take 1 tablet (500 mg total) by mouth 2 (two) times daily. 05/16/21   Henderly, Britni A, PA-C  polyethylene glycol (MIRALAX / GLYCOLAX) packet Take 17 g by mouth daily. 12/04/15   Loren Racer, MD      Allergies    Patient has no known allergies.    Review of Systems   Review of Systems  Gastrointestinal:  Positive for abdominal pain.  All other systems reviewed and are negative.   Physical Exam Updated Vital Signs BP (!) 120/58 (BP Location: Left Arm)   Pulse 88   Temp 99.1 F (37.3 C) (Oral)   Resp 16   Ht 5' (1.524 m)   Wt 77.1 kg   LMP 01/16/2022   SpO2 100%   BMI 33.20 kg/m  Physical Exam Vitals and nursing note reviewed.  Constitutional:  General: She is not in acute distress.    Appearance: She is well-developed.  HENT:     Head: Normocephalic and atraumatic.  Eyes:     Conjunctiva/sclera: Conjunctivae normal.  Cardiovascular:     Rate and Rhythm: Normal rate and regular rhythm.     Heart sounds: No murmur heard. Pulmonary:     Effort: Pulmonary effort is normal. No respiratory distress.     Breath sounds: Normal breath sounds.  Abdominal:     Palpations: Abdomen is soft.     Tenderness: There is abdominal tenderness in the right lower quadrant. There is no right CVA tenderness or left CVA tenderness.  Genitourinary:    Comments: Patient  declined GU exam and preference of self swab. Musculoskeletal:        General: No swelling.     Cervical back: Neck supple.  Skin:    General: Skin is warm and dry.     Capillary Refill: Capillary refill takes less than 2 seconds.  Neurological:     Mental Status: She is alert.  Psychiatric:        Mood and Affect: Mood normal.     ED Results / Procedures / Treatments   Labs (all labs ordered are listed, but only abnormal results are displayed) Labs Reviewed  URINALYSIS, ROUTINE W REFLEX MICROSCOPIC - Abnormal; Notable for the following components:      Result Value   Nitrite POSITIVE (*)    All other components within normal limits  COMPREHENSIVE METABOLIC PANEL - Abnormal; Notable for the following components:   Anion gap 4 (*)    All other components within normal limits  URINALYSIS, MICROSCOPIC (REFLEX) - Abnormal; Notable for the following components:   Bacteria, UA MANY (*)    All other components within normal limits  WET PREP, GENITAL  PREGNANCY, URINE  CBC WITH DIFFERENTIAL/PLATELET  LIPASE, BLOOD  GC/CHLAMYDIA PROBE AMP ([place]) NOT AT [facility]    EKG None  Radiology CT ABDOMEN PELVIS W CONTRAST  Result Date: 02/05/2022 CLINICAL DATA:  Right lower quadrant abdominal pain EXAM: CT ABDOMEN AND PELVIS WITH CONTRAST TECHNIQUE: Multidetector CT imaging of the abdomen and pelvis was performed using the standard protocol following bolus administration of intravenous contrast. RADIATION DOSE REDUCTION: This exam was performed according to the departmental dose-optimization program which includes automated exposure control, adjustment of the mA and/or kV according to patient size and/or use of iterative reconstruction technique. CONTRAST:  31mL OMNIPAQUE IOHEXOL 300 MG/ML  SOLN COMPARISON:  12/03/2015 FINDINGS: Lower chest: No acute pleural or parenchymal lung disease. Hepatobiliary: No focal liver abnormality is seen. No gallstones, gallbladder wall thickening, or biliary  dilatation. Pancreas: Unremarkable. No pancreatic ductal dilatation or surrounding inflammatory changes. Spleen: Normal in size without focal abnormality. Adrenals/Urinary Tract: Adrenal glands are unremarkable. Kidneys are normal, without renal calculi, focal lesion, or hydronephrosis. Bladder is unremarkable. Stomach/Bowel: No bowel obstruction or ileus. Normal appendix right lower quadrant. No bowel wall thickening or inflammatory change. Vascular/Lymphatic: No significant vascular findings are present. No enlarged abdominal or pelvic lymph nodes. Reproductive: Uterus and bilateral adnexa are unremarkable. Other: No free fluid or free intraperitoneal gas. No abdominal wall hernia. Musculoskeletal: No acute or destructive bony lesions. Reconstructed images demonstrate no additional findings. IMPRESSION: 1. No acute intra-abdominal or intrapelvic process. Normal appendix. Electronically Signed   By: Sharlet Salina M.D.   On: 02/05/2022 18:00    Procedures Procedures    Medications Ordered in ED Medications  sodium chloride 0.9 % bolus 500 mL (  500 mLs Intravenous New Bag/Given 02/05/22 1731)  ketorolac (TORADOL) 30 MG/ML injection 15 mg (15 mg Intravenous Given 02/05/22 1731)  ondansetron (ZOFRAN) injection 4 mg (4 mg Intravenous Given 02/05/22 1731)  iohexol (OMNIPAQUE) 300 MG/ML solution 80 mL (80 mLs Intravenous Contrast Given 02/05/22 1747)    ED Course/ Medical Decision Making/ A&P                           Medical Decision Making Amount and/or Complexity of Data Reviewed Labs: ordered. Radiology: ordered.  Risk Prescription drug management.   This patient presents to the ED for concern of abdominal pain, this involves an extensive number of treatment options, and is a complaint that carries with it a high risk of complications and morbidity.  The differential diagnosis includes ovarian torsion, ovarian mass, PID, nephrolithiasis, appendicitis, volvulus, SBO/LBO, pyelonephritis,  diverticulitis, AAA, aortic dissection, CBD pathology, cholecystitis   Co morbidities that complicate the patient evaluation  See HPI   Additional history obtained:  Additional history obtained from EMR External records from outside source obtained and reviewed including hospital records   Lab Tests:  I Ordered, and personally interpreted labs.  The pertinent results include: No leukocytosis.  No evidence anemia.  Platelets within range.  No electrolyte abnormalities appreciated.  No transaminitis.  No renal dysfunction.  UA significant for positive nitrite many bacteria indicative of urinary tract infection.  Wet prep and GC chlamydia pending; patient elected to leave prior to results with follow-up with primary care.   Imaging Studies ordered:  I ordered imaging studies including CT abdomen pelvis I independently visualized and interpreted imaging which showed no acute intra-abdominal abnormalities I agree with the radiologist interpretation   Cardiac Monitoring: / EKG:  The patient was maintained on a cardiac monitor.  I personally viewed and interpreted the cardiac monitored which showed an underlying rhythm of: Sinus rhythm   Consultations Obtained:  N/a   Problem List / ED Course / Critical interventions / Medication management  Acute cystitis without hematuria I ordered medication including Toradol for pain, Zofran for nausea   Reevaluation of the patient after these medicines showed that the patient resolved I have reviewed the patients home medicines and have made adjustments as needed   Social Determinants of Health:  Denies tobacco, licit drug use   Test / Admission - Considered:  Acute cystitis without hematuria Vitals signs \\within  normal range and stable throughout visit. Laboratory/imaging studies significant for: See above Patient symptoms likely secondary to evidence of acute cystitis without hematuria.  Patient CT abdomen pelvis negative for any  acute intra-abdominal abnormalities.  Patient noted complete resolution of symptoms with administration of Toradol as well as Zofran while emergency department.  Outpatient therapy recommended with ibuprofen, Zofran for nausea as well as oral antibiotics in the form of cefadroxil.  Patient elected to leave prior to self swab of wet prep and GC chlamydia resulting.  She was elected to follow MyChart and contact primary care for potential treatment if results are positive.  Treatment plan discussed at length with patient and she acknowledged understanding was agreeable to said plan. Worrisome signs and symptoms were discussed with the patient, and the patient acknowledged understanding to return to the ED if noticed. Patient was stable upon discharge.          Final Clinical Impression(s) / ED Diagnoses Final diagnoses:  Cystitis    Rx / DC Orders ED Discharge Orders  Ordered    ondansetron (ZOFRAN-ODT) 4 MG disintegrating tablet  Every 8 hours PRN        02/05/22 1824    cefadroxil (DURICEF) 500 MG capsule  2 times daily        02/05/22 1824    ibuprofen (ADVIL) 600 MG tablet  Every 6 hours PRN        02/05/22 1824              Peter GarterRobbins, Jurnee Nakayama A, PA 02/05/22 1842    Virgina Norfolkuratolo, Adam, DO 02/05/22 2153

## 2022-02-05 NOTE — Discharge Instructions (Addendum)
Note that your workup today was overall reassuring.  CT scan of the abdomen showed no acute abnormalities.  Your urine did show signs of infection so we will treat with antibiotics twice daily.  Recommend taking at home Tylenol/ibuprofen as needed for pain.  Recommend follow-up with your primary care for reevaluation of your symptoms.  Follow MyChart app for results of your self swabs and seek primary care for potential treatment if you test positive.  Please not hesitate to return to emergency department for worrisome signs and symptoms we discussed become apparent.

## 2022-02-05 NOTE — ED Notes (Signed)
Patient transported to CT 

## 2022-02-05 NOTE — ED Triage Notes (Signed)
RLQ pain since last night. Pain worse with movement

## 2022-02-05 NOTE — ED Notes (Signed)
Instructed on NPO and u/a

## 2022-02-07 LAB — GC/CHLAMYDIA PROBE AMP (~~LOC~~) NOT AT ARMC
Chlamydia: NEGATIVE
Comment: NEGATIVE
Comment: NORMAL
Neisseria Gonorrhea: NEGATIVE

## 2022-02-26 ENCOUNTER — Other Ambulatory Visit: Payer: Self-pay

## 2022-02-26 ENCOUNTER — Emergency Department (HOSPITAL_BASED_OUTPATIENT_CLINIC_OR_DEPARTMENT_OTHER)
Admission: EM | Admit: 2022-02-26 | Discharge: 2022-02-26 | Discharge status: Against Medical Advice | Payer: Medicaid Other | Attending: Emergency Medicine | Admitting: Emergency Medicine

## 2022-02-26 DIAGNOSIS — M549 Dorsalgia, unspecified: Secondary | ICD-10-CM | POA: Diagnosis not present

## 2022-02-26 DIAGNOSIS — Z5321 Procedure and treatment not carried out due to patient leaving prior to being seen by health care provider: Secondary | ICD-10-CM | POA: Diagnosis not present

## 2022-02-27 ENCOUNTER — Emergency Department (HOSPITAL_BASED_OUTPATIENT_CLINIC_OR_DEPARTMENT_OTHER)
Admission: EM | Admit: 2022-02-27 | Discharge: 2022-02-27 | Discharge status: Against Medical Advice | Payer: Medicaid Other | Attending: Emergency Medicine | Admitting: Emergency Medicine

## 2022-02-27 DIAGNOSIS — M546 Pain in thoracic spine: Secondary | ICD-10-CM | POA: Diagnosis present

## 2022-02-27 DIAGNOSIS — Z5321 Procedure and treatment not carried out due to patient leaving prior to being seen by health care provider: Secondary | ICD-10-CM | POA: Diagnosis not present

## 2022-02-27 NOTE — ED Notes (Signed)
No answer in lobby for room x 2

## 2022-02-27 NOTE — ED Triage Notes (Signed)
Patient presents to ED via POV from home. Here with upper back pain. Denies recent injury or trauma. Patient expresses concern for a pulled muscle.

## 2022-02-27 NOTE — ED Notes (Signed)
No answer x1

## 2022-02-28 ENCOUNTER — Emergency Department (HOSPITAL_BASED_OUTPATIENT_CLINIC_OR_DEPARTMENT_OTHER): Payer: Medicaid Other

## 2022-02-28 ENCOUNTER — Other Ambulatory Visit: Payer: Self-pay

## 2022-02-28 ENCOUNTER — Emergency Department (HOSPITAL_BASED_OUTPATIENT_CLINIC_OR_DEPARTMENT_OTHER)
Admission: EM | Admit: 2022-02-28 | Discharge: 2022-02-28 | Disposition: A | Discharge status: Home / Self Care | Payer: Medicaid Other | Attending: Emergency Medicine | Admitting: Emergency Medicine

## 2022-02-28 ENCOUNTER — Encounter (HOSPITAL_BASED_OUTPATIENT_CLINIC_OR_DEPARTMENT_OTHER): Payer: Self-pay | Admitting: Emergency Medicine

## 2022-02-28 DIAGNOSIS — M546 Pain in thoracic spine: Secondary | ICD-10-CM | POA: Diagnosis present

## 2022-02-28 LAB — URINALYSIS, ROUTINE W REFLEX MICROSCOPIC
Bilirubin Urine: NEGATIVE
Glucose, UA: NEGATIVE mg/dL
Hgb urine dipstick: NEGATIVE
Ketones, ur: NEGATIVE mg/dL
Leukocytes,Ua: NEGATIVE
Nitrite: NEGATIVE
Protein, ur: NEGATIVE mg/dL
Specific Gravity, Urine: 1.02 (ref 1.005–1.030)
pH: 6.5 (ref 5.0–8.0)

## 2022-02-28 LAB — PREGNANCY, URINE: Preg Test, Ur: NEGATIVE

## 2022-02-28 MED ORDER — CYCLOBENZAPRINE HCL 10 MG PO TABS: 10.0000 mg | ORAL_TABLET | Freq: Three times a day (TID) | ORAL | 0 refills | Status: AC | PRN

## 2022-02-28 MED ORDER — LIDOCAINE 5 % EX PTCH
1.0000 | MEDICATED_PATCH | Freq: Once | CUTANEOUS | Status: DC
  Administered 2022-02-28: 1 via TRANSDERMAL
  Filled 2022-02-28: qty 1

## 2022-02-28 MED ORDER — NAPROXEN 375 MG PO TABS: 375.0000 mg | ORAL_TABLET | Freq: Two times a day (BID) | ORAL | 0 refills | Status: AC

## 2022-02-28 MED ORDER — KETOROLAC TROMETHAMINE 30 MG/ML IJ SOLN
30.0000 mg | Freq: Once | INTRAMUSCULAR | Status: AC
  Administered 2022-02-28: 30 mg via INTRAMUSCULAR
  Filled 2022-02-28: qty 1

## 2022-02-28 MED ORDER — LIDOCAINE 5 % EX PTCH: 1.0000 | MEDICATED_PATCH | CUTANEOUS | 0 refills | Status: AC

## 2022-02-28 NOTE — ED Provider Notes (Signed)
[facility] [facility] Provider Note   CSN: [id number] Arrival date & time: 02/28/22  1809     History  Chief Complaint  Patient presents with   Back Pain    Judy Rubio is a 22 y.o. female.  HPI 22 year old female presents with right thoracic back pain. Woke up with it 2 days ago. No fevers, chest pain, dyspnea or abdominal pain. No extremity weakness/numbness. Has tried some hydrocodone, icy-hot and had no significant relief. Pain is worse with any movement, including stretching, bending, palpation, twisting.   Home Medications Prior to Admission medications   Medication Sig Start Date End Date Taking? Authorizing Provider  cyclobenzaprine (FLEXERIL) 10 MG tablet Take 1 tablet (10 mg total) by mouth 3 (three) times daily as needed for muscle spasms. 02/28/22  Yes Sherwood Gambler, MD  lidocaine (LIDODERM) 5 % Place 1 patch onto the skin daily. Remove & Discard patch within 12 hours or as directed by MD 02/28/22  Yes Sherwood Gambler, MD  naproxen (NAPROSYN) 375 MG tablet Take 1 tablet (375 mg total) by mouth 2 (two) times daily. 02/28/22  Yes Sherwood Gambler, MD  cefadroxil (DURICEF) 500 MG capsule Take 1 capsule (500 mg total) by mouth 2 (two) times daily. 02/05/22   Wilnette Kales, PA  cetirizine (ZYRTEC ALLERGY) 10 MG tablet Take 1 tablet (10 mg total) by mouth daily. 05/16/21   Henderly, Britni A, PA-C  dicyclomine (BENTYL) 20 MG tablet Take 1 tablet (20 mg total) by mouth 2 (two) times daily as needed for spasms. Patient not taking: Reported on 11/25/2021 12/04/15   Julianne Rice, MD  erythromycin ophthalmic ointment Place a 1/2 inch ribbon of ointment into the lower eyelid 4x/day for 7 days. For R eye. 11/25/21   Small, Brooke L, PA  fluticasone (FLONASE) 50 MCG/ACT nasal spray Place 2 sprays into both nostrils daily. 05/16/21   Henderly, Britni A, PA-C  ibuprofen (ADVIL) 600 MG tablet Take 1 tablet (600 mg total) by mouth every 6 (six) hours as needed.  02/05/22   Dion Saucier A, PA  lidocaine (XYLOCAINE) 2 % solution Use as directed 15 mLs in the mouth or throat every 6 (six) hours as needed for mouth pain. 05/16/21   Henderly, Britni A, PA-C  ondansetron (ZOFRAN-ODT) 4 MG disintegrating tablet Take 1 tablet (4 mg total) by mouth every 8 (eight) hours as needed for nausea or vomiting. 02/05/22   Dion Saucier A, PA  polyethylene glycol (MIRALAX / GLYCOLAX) packet Take 17 g by mouth daily. 12/04/15   Julianne Rice, MD      Allergies    Patient has no known allergies.    Review of Systems   Review of Systems  Constitutional:  Negative for fever.  Respiratory:  Negative for shortness of breath.   Cardiovascular:  Negative for chest pain.  Gastrointestinal:  Negative for abdominal pain.  Musculoskeletal:  Positive for back pain.  Neurological:  Negative for weakness and numbness.    Physical Exam Updated Vital Signs BP 128/82   Pulse 80   Temp 98.2 F (36.8 C) (Oral)   Resp 14   Ht 5' (1.524 m)   Wt 82.6 kg   LMP 01/16/2022   SpO2 100%   BMI 35.54 kg/m  Physical Exam Vitals and nursing note reviewed.  Constitutional:      Appearance: She is well-developed.  HENT:     Head: Normocephalic and atraumatic.  Cardiovascular:     Rate and Rhythm: Normal rate  and regular rhythm.     Heart sounds: Normal heart sounds.  Pulmonary:     Effort: Pulmonary effort is normal.     Breath sounds: Normal breath sounds.  Abdominal:     Palpations: Abdomen is soft.     Tenderness: There is no abdominal tenderness.  Musculoskeletal:     Thoracic back: Tenderness present. No bony tenderness.     Lumbar back: No tenderness or bony tenderness.       Back:  Skin:    General: Skin is warm and dry.  Neurological:     Mental Status: She is alert.     Comments: 5/5 strength in BLE.     ED Results / Procedures / Treatments   Labs (all labs ordered are listed, but only abnormal results are displayed) Labs Reviewed  URINALYSIS,  ROUTINE W REFLEX MICROSCOPIC  PREGNANCY, URINE    EKG None  Radiology DG Chest 2 View  Result Date: 02/28/2022 CLINICAL DATA:  Right scapular and back pain for 3 days EXAM: CHEST - 2 VIEW COMPARISON:  None Available. FINDINGS: The heart size and mediastinal contours are within normal limits. Both lungs are clear. The visualized skeletal structures are unremarkable. IMPRESSION: No active cardiopulmonary disease. Electronically Signed   By: Randa Ngo M.D.   On: 02/28/2022 20:54    Procedures Procedures    Medications Ordered in ED Medications  lidocaine (LIDODERM) 5 % 1 patch (1 patch Transdermal Patch Applied 02/28/22 2136)  ketorolac (TORADOL) 30 MG/ML injection 30 mg (30 mg Intramuscular Given 02/28/22 2053)    ED Course/ Medical Decision Making/ A&P                             Medical Decision Making Amount and/or Complexity of Data Reviewed External Data Reviewed: notes. Labs: ordered.    Details: UA unremarkable, pregnancy test negative Radiology: ordered and independent interpretation performed.    Details: No pneumothorax on chest x-ray  Risk Prescription drug management.   Patient presents with atraumatic right thoracic back pain.  Doubt acute spinal cord emergency.  No trauma to suggest fracture.  Given the location, chest x-ray was obtained though low suspicion for pneumothorax.  This is negative.  She was given Toradol with some improvement.  Overall she is well-appearing with no neurosymptoms.  Will treat conservatively with NSAIDs, muscle relaxer, Lidoderm patch.  Otherwise, return if symptoms worsen but I highly doubt ACS, PE, dissection, etc.        Final Clinical Impression(s) / ED Diagnoses Final diagnoses:  Acute right-sided thoracic back pain    Rx / DC Orders ED Discharge Orders          Ordered    lidocaine (LIDODERM) 5 %  Every 24 hours        02/28/22 2135    naproxen (NAPROSYN) 375 MG tablet  2 times daily        02/28/22 2135     cyclobenzaprine (FLEXERIL) 10 MG tablet  3 times daily PRN        02/28/22 2135              Sherwood Gambler, MD 02/28/22 2151

## 2022-02-28 NOTE — Discharge Instructions (Addendum)
You are being treated with Naproxen, which is an NSAID. Do not take other NSAIDs such as Ibuprofen/Advil/Aleve/Motrin/Goody Powders/BC powders/Meloxicam/Diclofenac/Indomethacin and other Nonsteroidal anti-inflammatory medications. You may still take Tylenol.  You are also being prescribed Flexeril, which is a muscle relaxer. Do not combine this with other medicines or alcohol. Do not drive or operate heavy machinery while on this medication.  If you develop worsening, recurrent, or continued back pain, numbness or weakness in the legs, incontinence of your bowels or bladders, numbness of your buttocks, fever, abdominal pain, or any other new/concerning symptoms then return to the ER for evaluation.

## 2022-02-28 NOTE — ED Triage Notes (Addendum)
States has had back pain  x 3 days  came yesterday but didn't stay denies injury  upper back  rt shoulder pain, hurts to move and turn head and neck states has been nauseated ,  LMP 02/18/22

## 2022-05-29 ENCOUNTER — Encounter: Payer: Self-pay | Admitting: *Deleted

## 2023-01-17 ENCOUNTER — Emergency Department (HOSPITAL_BASED_OUTPATIENT_CLINIC_OR_DEPARTMENT_OTHER)
Admission: EM | Admit: 2023-01-17 | Discharge: 2023-01-17 | Discharge status: Against Medical Advice | Payer: Medicaid Other | Attending: Emergency Medicine | Admitting: Emergency Medicine

## 2023-01-17 ENCOUNTER — Other Ambulatory Visit: Payer: Self-pay

## 2023-01-17 ENCOUNTER — Encounter (HOSPITAL_BASED_OUTPATIENT_CLINIC_OR_DEPARTMENT_OTHER): Payer: Self-pay

## 2023-01-17 DIAGNOSIS — Z5321 Procedure and treatment not carried out due to patient leaving prior to being seen by health care provider: Secondary | ICD-10-CM | POA: Diagnosis not present

## 2023-01-17 DIAGNOSIS — R519 Headache, unspecified: Secondary | ICD-10-CM | POA: Insufficient documentation

## 2023-01-17 HISTORY — DX: Migraine, unspecified, not intractable, without status migrainosus: G43.909

## 2023-01-17 LAB — RESP PANEL BY RT-PCR (RSV, FLU A&B, COVID)  RVPGX2
Influenza A by PCR: NEGATIVE
Influenza B by PCR: NEGATIVE
Resp Syncytial Virus by PCR: NEGATIVE
SARS Coronavirus 2 by RT PCR: NEGATIVE

## 2023-01-17 NOTE — ED Triage Notes (Signed)
Pt reports headache that began on Monday. Pain left side . Varies in intensity. Puffiness under right. Mild light sensitivity. Pt has taken tylenol and motrin without complete relief. Mild nasal congestion. Hx of migraines, but none for 5 years
